# Patient Record
Sex: Male | Born: 1955 | Race: Black or African American | Hispanic: No | Marital: Single | Smoking: Never smoker
Health system: Southern US, Community
[De-identification: ages and names within clinical notes are randomized; demographics above are authoritative.]

## PROBLEM LIST (undated history)

## (undated) DIAGNOSIS — G459 Transient cerebral ischemic attack, unspecified: Secondary | ICD-10-CM

## (undated) DIAGNOSIS — I10 Essential (primary) hypertension: Secondary | ICD-10-CM

## (undated) HISTORY — PX: CAROTID ENDARTERECTOMY: SUR193

---

## 2018-02-16 ENCOUNTER — Emergency Department (HOSPITAL_BASED_OUTPATIENT_CLINIC_OR_DEPARTMENT_OTHER): Payer: Worker's Compensation

## 2018-02-16 ENCOUNTER — Other Ambulatory Visit: Payer: Self-pay

## 2018-02-16 ENCOUNTER — Encounter (HOSPITAL_BASED_OUTPATIENT_CLINIC_OR_DEPARTMENT_OTHER): Payer: Self-pay | Admitting: Emergency Medicine

## 2018-02-16 ENCOUNTER — Emergency Department (HOSPITAL_BASED_OUTPATIENT_CLINIC_OR_DEPARTMENT_OTHER)
Admission: EM | Admit: 2018-02-16 | Discharge: 2018-02-16 | Disposition: A | Discharge status: Home / Self Care | Payer: Worker's Compensation | Attending: Emergency Medicine | Admitting: Emergency Medicine

## 2018-02-16 DIAGNOSIS — Z8673 Personal history of transient ischemic attack (TIA), and cerebral infarction without residual deficits: Secondary | ICD-10-CM | POA: Insufficient documentation

## 2018-02-16 DIAGNOSIS — Y929 Unspecified place or not applicable: Secondary | ICD-10-CM | POA: Diagnosis not present

## 2018-02-16 DIAGNOSIS — W938XXA Exposure to other excessive cold of man-made origin, initial encounter: Secondary | ICD-10-CM | POA: Diagnosis not present

## 2018-02-16 DIAGNOSIS — I10 Essential (primary) hypertension: Secondary | ICD-10-CM | POA: Insufficient documentation

## 2018-02-16 DIAGNOSIS — T699XXA Effect of reduced temperature, unspecified, initial encounter: Secondary | ICD-10-CM | POA: Insufficient documentation

## 2018-02-16 DIAGNOSIS — Y9389 Activity, other specified: Secondary | ICD-10-CM | POA: Diagnosis not present

## 2018-02-16 DIAGNOSIS — S6991XA Unspecified injury of right wrist, hand and finger(s), initial encounter: Secondary | ICD-10-CM

## 2018-02-16 DIAGNOSIS — Y999 Unspecified external cause status: Secondary | ICD-10-CM | POA: Diagnosis not present

## 2018-02-16 DIAGNOSIS — S6981XA Other specified injuries of right wrist, hand and finger(s), initial encounter: Secondary | ICD-10-CM | POA: Diagnosis present

## 2018-02-16 HISTORY — DX: Essential (primary) hypertension: I10

## 2018-02-16 HISTORY — DX: Transient cerebral ischemic attack, unspecified: G45.9

## 2018-02-16 MED ORDER — TRAMADOL HCL 50 MG PO TABS: 50.0000 mg | ORAL_TABLET | Freq: Four times a day (QID) | ORAL | 0 refills | Status: AC | PRN

## 2018-02-16 MED ORDER — SILVER SULFADIAZINE 1 % EX CREA
TOPICAL_CREAM | CUTANEOUS | Status: AC
  Filled 2018-02-16: qty 85

## 2018-02-16 MED ORDER — SILVER SULFADIAZINE 1 % EX CREA
TOPICAL_CREAM | Freq: Once | CUTANEOUS | Status: AC
  Administered 2018-02-16: 16:00:00 via TOPICAL

## 2018-02-16 MED FILL — traMADol HCL 50 MG TABS: 50 | 3 days supply | Qty: 15 | Fill #0
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
## 2018-02-16 NOTE — ED Notes (Signed)
Ring cutter used to cut gold colored ring off from right middle finger, ring placed in clean urine sample cup and given to patient by Dene GentryKayla Mickey EMT
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
